# Patient Record
Sex: Male | Born: 1970 | Race: White | Hispanic: No | Marital: Married
Health system: Southern US, Community
[De-identification: ages and names within clinical notes are randomized; demographics above are authoritative.]

## PROBLEM LIST (undated history)

## (undated) DIAGNOSIS — L309 Dermatitis, unspecified: Secondary | ICD-10-CM

## (undated) DIAGNOSIS — G4700 Insomnia, unspecified: Secondary | ICD-10-CM

## (undated) HISTORY — DX: Dermatitis, unspecified: L30.9

## (undated) HISTORY — DX: Insomnia, unspecified: G47.00

## (undated) HISTORY — PX: OTHER SURGICAL HISTORY: SHX169

---

## 2001-05-18 ENCOUNTER — Emergency Department (HOSPITAL_COMMUNITY): Admission: EM | Admit: 2001-05-18 | Discharge: 2001-05-18 | Payer: Self-pay

## 2001-05-18 ENCOUNTER — Encounter: Payer: Self-pay | Admitting: Emergency Medicine

## 2003-07-08 ENCOUNTER — Ambulatory Visit (HOSPITAL_BASED_OUTPATIENT_CLINIC_OR_DEPARTMENT_OTHER): Admission: RE | Admit: 2003-07-08 | Discharge: 2003-07-08 | Payer: Self-pay | Admitting: Family Medicine

## 2007-11-14 ENCOUNTER — Observation Stay (HOSPITAL_COMMUNITY): Admission: AD | Admit: 2007-11-14 | Discharge: 2007-11-16 | Payer: Self-pay | Admitting: Internal Medicine

## 2007-11-25 ENCOUNTER — Inpatient Hospital Stay (HOSPITAL_COMMUNITY): Admission: AD | Admit: 2007-11-25 | Discharge: 2007-12-04 | Payer: Self-pay | Admitting: Internal Medicine

## 2007-12-11 ENCOUNTER — Encounter: Admission: RE | Admit: 2007-12-11 | Discharge: 2007-12-11 | Payer: Self-pay | Admitting: Surgery

## 2008-02-20 ENCOUNTER — Encounter: Admission: RE | Admit: 2008-02-20 | Discharge: 2008-02-20 | Payer: Self-pay | Admitting: Gastroenterology

## 2008-07-15 ENCOUNTER — Encounter: Admission: RE | Admit: 2008-07-15 | Discharge: 2008-07-15 | Payer: Self-pay | Admitting: Family Medicine

## 2010-10-17 NOTE — Discharge Summary (Signed)
NAMEJOVON, [name]                ACCOUNT NO.:  [id number]   MEDICAL RECORD NO.:  [id number]          PATIENT TYPE:  INP   LOCATION:  1502                         FACILITY:  [facility]   PHYSICIAN:  Michiel Cowboy, MDDATE OF BIRTH:  [birth year]69   DATE OF ADMISSION:  11/14/2007  DATE OF DISCHARGE:  11/16/2007                               DISCHARGE SUMMARY   ATTENDING PHYSICIAN:  Michiel Cowboy, MD   PRIMARY CARE PHYSICIAN:  Dr. Para March.   DISCHARGE DIAGNOSIS:  1. Dehydration.  2. Diarrhea likely secondary to gastroenteritis.  3. History of depression.   STUDIES:  KUB done on 14 November 2007 showing nonspecific bowel gas pattern  and no obvious obstruction, perforation.  No distension.  There is  central, small bowels of air and air fluid levels, but no distension.   HOSPITAL COURSE:  Patient is a 40 year old gentleman who was admitted  with one week bout of diarrhea.  After his admission, his diet was  slowly advanced.  He was given IV fluids for rehydration.  At first  patient was orthostatic, but after aggressive rehydration, orthostasis  resolved.  At this point patient is urinating frequently,  nonorthostatic, feeling well.  Tolerated breakfast and lunch without  diarrhea.  Although did report a bout of diarrhea yesterday, which was  sent for studies.  Currently they are pending.  Patient feels very well  and wants to go home.  We will discharge home with very close followup  with Dr. Para March.  Instructed patient to have low residue diet and  lactose free.  Give him Asacol as needed for diarrhea.  At this point, I  would give him Imodium until sure that this is noninfectious etiology.  If diarrhea returns, would recommend further outpatient workup.  Asked  patient to avoid use of frequent ibuprofen and do not use it for longer  than 2 weeks.   DISCHARGE MEDICATIONS:  1. Asacol 80 ng p.o. t.i.d..d as needed.  2. Cymbalta 60 mg p.o. daily.  3. Tritura 80 mg p.o. daily.  4. Follow up with Dr. Para March in one week or sooner if needed.      Michiel Cowboy, MD  Electronically Signed     AVD/MEDQ  D:  11/16/2007  T:  11/16/2007  Job:  [id number]

## 2010-10-17 NOTE — H&P (Signed)
[name], [name]                ACCOUNT NO.:  [id number]   MEDICAL RECORD NO.:  [id number]          PATIENT TYPE:  INP   LOCATION:  1502                         FACILITY:  [facility]   PHYSICIAN:  Michiel Cowboy, MDDATE OF BIRTH:  [birth year]   DATE OF ADMISSION:  11/14/2007  DATE OF DISCHARGE:                              HISTORY & PHYSICAL   ATTENDING PHYSICIAN:  Dwana Curd. Para March, M.D. with [facility] at  [facility].   CHIEF COMPLAINT:  Diarrhea.   Patient is a 40 year old gentleman with a history of depression, who  comes in with one week's worth of diarrhea and abdominal pain.  It  started on Sunday after a picnic at [facility].  Patient woke up in the  morning with abdominal pain and diarrhea, which persisted.  Patient  first went to his primary care [name] and at that point was deemed to  have possible viral gastroenteritis with no dehydration.  The patient  denied any nausea or vomiting.  Was able to keep po fluids.  His pain  first was epigastric, then became mid abdominal, and continued to be off  and on throughout the day.  Yesterday, he continued to have severe  diarrhea without any improvement, so today he presented to his primary  care [name].  He continues to have diarrhea and feels dehydration.  Prior to that appointment, he took some Imodium, after which his  diarrhea has stopped altogether.  In the office, he was noted to be  orthostatic.  [facility] called to admit the patient.   Patient also endorses that today he felt hot, like as if he had a fever,  but he never took his temperature.   PAST MEDICAL HISTORY:  Significant for depression.   SOCIAL HISTORY:  Patient currently smokes a half pack per day.  Does not  use drugs or drink alcohol.  Of note, patient has a remote history of  alcohol and drug abuse.  No IV drug abuse ever.   FAMILY HISTORY:  Noncontributory.   ALLERGIES:  Patient is allergic to PENICILLIN and ERYTHROMYCIN.   MEDICATIONS:  1. Cymbalta 60 mg p.o. daily.  2. Ibuprofen as needed.  3. Strattera 80 mg p.o. daily.  4. Gas-X, Maalox, and Tylenol as needed.   PHYSICAL EXAMINATION:  VITALS:  Temperature 98.9, heart rate 97, blood  pressure 130/80, respirations 18, satting 98% on room air.  Patient is  orthostatic.  Patient appears to be alert in no acute distress, lying on the  stretcher.  Dry mucous membranes.  Head atraumatic.  LUNGS:  Clear to auscultation bilaterally.  HEART:  Regular rate and rhythm with no murmurs, rubs or gallops.  ABDOMEN:  Soft, nontender, nondistended.  Patient currently has no  abdominal pain.  LOWER EXTREMITIES:  Without any edema.  Skin turgor within normal  limits.  NEUROLOGIC:  Intact.   LABS:  White blood cell count 11.2, hemoglobin 14.9, platelets 199.  Sodium 136, potassium 4.2, bicarb 30, creatinine 0.85.  LFTs within  normal limits.  Lipase 31, albumin 3.1.   Chest x-ray done showed nonspecific bowel gas pattern,  no obvious  obstruction or perforation, with some central small bowel loops, air,  and air/fluid levels, but no distention.   ASSESSMENT/PLAN:  This is a 40 year old gentleman with a history of  diarrhea, admitted further evaluation and rehydration.  1. Dehydration:  We will give IV fluids aggressively and check      orthostatics.  2. Diarrhea:  Will send for stool studies if patient has any more      bowel movements.  Currently, this has stopped.  We will pursue      further imaging if symptoms resume or if he has abdominal pain.      Currently, KUB did not show any evidence of intra-abdominal      pathology.  3. Depression:  Continue home meds.  4. Prophylaxis:  SCDs, Protonix.      Michiel Cowboy, MD  Electronically Signed     AVD/MEDQ  D:  11/14/2007  T:  11/14/2007  Job:  [id number]   cc:   Dwana Curd. Para March, M.D.  Fax: [phone]

## 2010-10-17 NOTE — Discharge Summary (Signed)
NAMEHALDON, CARLEY                ACCOUNT NO.:  [id number]   MEDICAL RECORD NO.:  [id number]          PATIENT TYPE:  INP   LOCATION:  1311                         FACILITY:  [facility]   PHYSICIAN:  Kela Millin, M.D.DATE OF BIRTH:  Oct 28, 1970   DATE OF ADMISSION:  11/25/2007  DATE OF DISCHARGE:  12/04/2007                               DISCHARGE SUMMARY   DISCHARGE DIAGNOSES:  1. Diverticulitis with pelvic/diverticular abscess.  2. History of depression.  3. Tobacco abuse.  4. History of narcotic addiction.  5. History of ADHD.   PROCEDURES AND STUDIES:  1. CT-guided drainage of pelvic abscess per interventional radiology.  2. CT scan of abdomen and pelvis done at outside location - reviewed      with [facility] radiology, and the patient noted to have about a 7-      cm pelvic abscess.  3. Followup CT scan on December 03, 2007 - mucosal thickening of the      terminal ileum may be related to adjacent diverticular abscess.      Correlate with signs and symptoms of inflammatory bowel disease per      radiologist.  4. CT scan of pelvis - marked reduction in the volume of large pelvic      abscess with only 2 small fluid collections remaining.  Marked      thickening of the terminal ileum.  May be secondary to reported      diverticular disease; however, consider inflammatory bowel disease      per radiologist.   BRIEF HISTORY:  The patient is a 40 year old white male with the above-  listed medical problems who presented with complaints of abdominal pain.  It was noted that he had been discharged from the hospitalist service  about 10 days prior to this admission following treatment for diarrhea  and dehydration.  The patient reported that at his follow-up visit with  Dr. Hyacinth Meeker he had been doing well but then developed severe left lower  quadrant pain.  Lab work revealed a white cell count of 22, and CT scan  was done at an outside facility which revealed left lower quadrant  abscess secondary to diverticulitis.  Dr. Hyacinth Meeker was contacted, and the  patient then directly admitted to the hospital for further evaluation  and management.  Upon admission, he was found to have a white cell count  of 18, and he admitted to some diarrhea but stated that it was better  and that the abdominal pain was also better.  He was admitted for  further evaluation and management.   Please see the full admission history and physical dictated on November 25, 2007 by Dr. Rito Ehrlich for the details of the admission physical exam as  well as the laboratory data.   HOSPITAL COURSE:  1. Diverticulitis with pelvic abscess:  Upon admission, the patient      was started on empiric antibiotics to cover for diverticulitis.      The CT scan of his abdomen was reviewed with interventional      radiology, and it was noted that the abscess  was 7 cm.  Following      this, interventional radiology/surgery were consulted, and CT-      guided drainage of the abscess was done on November 28, 2007 by Dr.      Fredia Sorrow, and grossly purulent fluid was obtained and sent for      cultures.  The cultures grew group A strep - strep pyrogenes.  The      patient was has been maintained on Cipro and Flagyl, and surgery      and interventional radiology have been following.  A repeat CT scan      was done which showed significant improvement in the abscess.  His      drain is still in place, and he got a total of 15 mL out in the      last 24 hours.  Interventional radiology, and surgery have seen the      patient and both recommend from this standpoint that he be      discharged home.  Surgery has instructed the patient to do daily      irrigation of the drain with 10 mL of sterile saline.  He is to      follow up for a repeat CT with surgery in 1 week as outpatient, and      at that point, injection prior to discontinuation of the drain will      be done.  His symptoms have improved; he is tolerating p.o. well       and has been advanced to regular diet which he has done well with.      He is afebrile and hemodynamically stable, and his last white cell      count prior to discharge today is 6.  On the repeat CT scan of the      abdomen, the radiologist indicated that the patient has significant      thickening of the terminal ileum, and it is recommended that the      patient follows up outpatient with gastroenterology when he has      recovered well from this episode for further evaluation/workup for      possible inflammatory bowel disease/Crohn's.  2. History of depression/attention-deficit hyperactivity disorder.      The patient was maintained on his outpatient medications and is to      continue this upon discharge.   DISCHARGE MEDICATIONS:  1. Cipro 500 mg p.o. b.i.d. for 2 more weeks or as directed per      surgery.  2. Flagyl 500 mg p.o. t.i.d. for 2 more weeks or as directed per      surgery.  3. Continue Cymbalta 60 mg p.o. daily.  4. Strattera 80 mg p.o. daily.  5. Also continue Asacol 80 mg p.o. t.i.d. as needed.  6. Tylenol 650 p.o. q.4-6 h p.r.n.   DISCHARGE INSTRUCTIONS:  1. The patient to follow up with Dr. Corliss Skains in 1 week and is to have a      CT scan on that follow-up visit.  2. The patient is to have drain irrigated with 10 mL of sterile saline      daily as instructed by surgery.  Home health to follow.   DISCHARGE CONDITION:  Improved/stable.      Kela Millin, M.D.  Electronically Signed     ACV/MEDQ  D:  12/04/2007  T:  12/04/2007  Job:  [id number]   cc:   Sigmund Hazel, M.D.  Fax: [phone]  Wilmon Arms. Corliss Skains, M.D.  [address] [place] [address] [id number]  [place]

## 2010-10-17 NOTE — Consult Note (Signed)
NAMEJASMOND, [name]                ACCOUNT NO.:  [id number]   MEDICAL RECORD NO.:  [id number]          PATIENT TYPE:  INP   LOCATION:  1311                         FACILITY:  [facility]   PHYSICIAN:  Wilmon Arms. Corliss Skains, M.D.      DATE OF BIRTH:   DATE OF CONSULTATION:  11/27/2007  DATE OF DISCHARGE:                                 CONSULTATION   REASON FOR CONSULTATION:  Diverticulitis with diverticular abscess.   HISTORY OF PRESENT ILLNESS:  The patient is a 40 year old male who  presents with a 3 week history of abdominal pain localized down to his  pelvis associated with dehydration and diarrhea.  He had been admitted  previously for hydration.  At his followup with Dr. Sigmund Hazel, his  primary care physician, he was noted to have an elevated white count of  22 with severe lower abdominal pain.  This is located in the pelvis in  the midline.  A CT scan showed a left lower quadrant abscess secondary  to diverticulitis.  He was admitted to the hospital on November 25, 2007.  He was placed on IV antibiotics.  The patient is continuing to have pain  but is feeling better.  He has been afebrile over the last day.   PAST MEDICAL HISTORY:  1. History of narcotic addiction.  2. Tobacco abuse.  3. History of depression.  4. ADHD.   MEDICATIONS:  Strattera, Cymbalta.   PAST SURGICAL HISTORY:  Wisdom tooth extraction.   ALLERGIES:  PENICILLIN WHICH CAUSES A RASH.  ERYTHROMYCIN WHICH CAUSES  STOMACH UPSET.   FAMILY HISTORY:  Noncontributory.   SOCIAL HISTORY:  He smokes 1/2 pack a day.  No alcohol use.   PHYSICAL EXAMINATION:  Temperature 98.4, pulse 77, respirations 20,  blood pressure 124/73.  Saturation 98%.  This is a well developed, well nourished male in no appar net distress.  HEENT:  EOMI.  Sclerae anicteric.  NECK:  No mass or thyromegaly.  LUNGS:  Clear to auscultation bilaterally.  Normal respiratory effort.  HEART:  Regular rate and rhythm.  No murmur.  ABDOMEN:  Positive  bowel sounds.  Soft, tender in both lower quadrants  and in the suprapubic region.  No palpable masses, no rebound, no  guarding.  EXTREMITIES:  No edema.  SKIN:  Warm and dry.  No signs of jaundice.   IMPRESSION:  Diverticulitis with peridiverticular abscess.  This  measures about 7 cm by report.   RECOMMENDATIONS:  Continue IV antibiotics.  Patient is scheduled for CT  guided drainage of the pelvic abscess tomorrow.  If this is successful  in draining the abscess, then he will need outpatient followup until the  time of drain removal.  He will then need a colonoscopy and probable  elective sigmoid colectomy.  If he worsens here in the hospital, then he  will need urgent sigmoid colectomy with  probable descending colostomy  Hartman's pouch.  This would be a temporary colostomy.  I discussed all  this with the patient and his wife.  We will continue to follow with  you.  Wilmon Arms. Tsuei, M.D.  Electronically Signed     MKT/MEDQ  D:  11/27/2007  T:  11/27/2007  Job:  [id number]   cc:   Ramiro Harvest, MD

## 2010-10-17 NOTE — H&P (Signed)
[name], [name]                ACCOUNT NO.:  [id number]   MEDICAL RECORD NO.:  [id number]          PATIENT TYPE:  INP   LOCATION:  1311                         [facility]ACILITY:  [facility]   PHYSICIAN:  Hollice Espy, M.D.DATE OF BIRTH:  1971/02/13   DATE OF ADMISSION:  11/25/2007  DATE OF DISCHARGE:                              HISTORY & PHYSICAL   The patient's PCP is Dr. Sigmund Hazel.   CHIEF COMPLAINT:  Is abdominal pain.   HISTORY OF PRESENT ILLNESS:  The patient is a 40 year old white male  with past medical history of depression and previous narcotic abuse, now  clean, who was discharged from the hospitalist service approximately 10  days ago for dehydration and diarrhea.  Initially was felt to be  secondary to gastroenteritis.  He was followed with Dr. Hyacinth Meeker and doing  well in his follow-up visit a week later and then today presents  complaining of severe lower quadrant abdominal pain.  A white count was  found to be elevated at 22, and the patient went for a CT scan and was  noted to have a left lower quadrant abscess secondary to acute  diverticulitis.  Dr. Hyacinth Meeker contacted [facility], and the patient  was made a direct ,admission and upon arrival to the floor, he had a  repeat labs done.  He was found to have a white count of 18.1.  The rest  of labs were unremarkable.  In review of his previous stool cultures,  these were all negative.  Currently, patient is feeling better after  receiving some IV fluids and medication for nausea.  Denies any  headaches, vision changes, dysphagia, recurrent chest pain,  palpitations, shortness of breath, wheeze, cough.  His belly pain is  better.  No hematuria, dysuria, constipation.  He had some diarrhea, now  better.  No focal extremity numbness, weakness or pain.  Overall, he  feels quite fatigued.  Review of systems otherwise negative.   The patient's past medical history includes:  1. History of narcotic addiction now clean.  2. Ongoing tobacco abuse.  3. History of depression.  4. History of ADHD.   MEDICATIONS:  He is on Strattera 80 mg p.o. daily and Cymbalta 60 mg  p.o. daily.   HE HAS ALLERGIES TO PENICILLIN, ERYTHROMYCIN AND HAS REQUESTED NOT TO BE  ON ANY TYPE OF NARCOTICS GIVEN HIS PREVIOUS HISTORY OF ADDICTION.   FAMILY HISTORY:  is noncontributory.   SOCIAL HISTORY:  He denies any alcohol use.  He smokes about a half-pack  per day and previously had a history narcotic abuse, now clean.   PHYSICAL EXAMINATION:  The patient's vitals on admission:  Temperature  98.6, heart rate 88, blood pressure 132/70 and respirations 18, O2  saturation 96% on room air.  GENERAL:  He is alert and oriented x3 in no apparent distress.  HEENT:  Normocephalic, atraumatic.  Mucous membranes are moist.  He has  no carotid bruits.  HEART:  Regular rate and rhythm.  S1-S2.  LUNGS:  Clear to auscultation bilaterally.  ABDOMEN:  Soft, nontender, mild distention.  Decreased bowel sounds.  EXTREMITIES:  Showed no clubbing, cyanosis or edema.   LABORATORY WORK:  Sodium 136, potassium 4.5, chloride 97, bicarb 31, BUN  10, creatinine 0.9, glucose 108.  White count 18.1, H&H 14.8 and 44, MCV  of 84, platelet count 262.   PLAN:  1. Diverticulitis with abscess.  N.p.o.  IV antibiotics plus IV PPI.      Will follow white count in the morning.  2. History of narcotic addiction.  He is clean now.  No narcotics.      Treat with p.r.n. Toradol.  The patient has requested no opiates.  3. Depression.  Continue SSRI.  4. Tobacco abuse.  The patient declines patch.      Hollice Espy, M.D.  Electronically Signed     SKK/MEDQ  D:  11/25/2007  T:  11/25/2007  Job:  [id number]   cc:   Sigmund Hazel, M.D.  Fax: [phone]

## 2011-03-01 LAB — CBC
HCT: 36.9 — ABNORMAL LOW
HCT: 40.4
HCT: 43.7
HCT: 37 — ABNORMAL LOW
HCT: 37.1 — ABNORMAL LOW
HCT: 37.5 — ABNORMAL LOW
HCT: 38.1 — ABNORMAL LOW
HCT: 44.4
HCT: 42
Hemoglobin: 12.6 — ABNORMAL LOW
Hemoglobin: 13.8
Hemoglobin: 14.9
Hemoglobin: 12.2 — ABNORMAL LOW
Hemoglobin: 12.3 — ABNORMAL LOW
Hemoglobin: 12.4 — ABNORMAL LOW
Hemoglobin: 12.6 — ABNORMAL LOW
Hemoglobin: 14.8
Hemoglobin: 14
MCHC: 34.2
MCHC: 33.2
MCHC: 33.3
MCHC: 33.3
MCV: 83
MCV: 84.3
MCV: 83
MCV: 83.1
MCV: 84.3
MCV: 82.8
Platelets: 199
Platelets: 194
Platelets: 214
Platelets: 221
Platelets: 235
Platelets: 238
Platelets: 262
Platelets: 262
RBC: 4.45
RBC: 4.81
RBC: 4.4
RBC: 4.44
RBC: 5.07
RDW: 14.3
RDW: 13.6
RDW: 13.9
RDW: 14
RDW: 14.2
RDW: 14.3
RDW: 14.3
RDW: 13.8
WBC: 15.5 — ABNORMAL HIGH
WBC: 4
WBC: 4.3
WBC: 7
WBC: 9.8
WBC: 4.8
WBC: 6

## 2011-03-01 LAB — BASIC METABOLIC PANEL
BUN: 10
BUN: 11
BUN: 3 — ABNORMAL LOW
BUN: 4 — ABNORMAL LOW
BUN: 5 — ABNORMAL LOW
CO2: 24
CO2: 31
CO2: 31
Calcium: 7.7 — ABNORMAL LOW
Calcium: 8.4
Calcium: 8.1 — ABNORMAL LOW
Calcium: 8.2 — ABNORMAL LOW
Calcium: 8.6
Calcium: 9
Chloride: 104
Chloride: 101
Chloride: 103
Chloride: 103
Creatinine, Ser: 0.74
Creatinine, Ser: 0.79
Creatinine, Ser: 0.91
GFR calc Af Amer: >60
GFR calc Af Amer: >60
GFR calc Af Amer: >60
GFR calc Af Amer: >60
GFR calc non Af Amer: >60
GFR calc non Af Amer: >60
GFR calc non Af Amer: >60
GFR calc non Af Amer: >60
GFR calc non Af Amer: >60
GFR calc non Af Amer: >60
Glucose, Bld: 109 — ABNORMAL HIGH
Glucose, Bld: 88
Glucose, Bld: 100 — ABNORMAL HIGH
Glucose, Bld: 103 — ABNORMAL HIGH
Glucose, Bld: 108 — ABNORMAL HIGH
Glucose, Bld: 116 — ABNORMAL HIGH
Glucose, Bld: 93
Potassium: 3.3 — ABNORMAL LOW
Potassium: 3.8
Potassium: 3.6
Potassium: 3.8
Potassium: 4.1
Potassium: 4.2
Potassium: 4.3
Sodium: 136
Sodium: 138
Sodium: 136
Sodium: 136
Sodium: 136
Sodium: 137
Sodium: 137
Sodium: 139
Sodium: 140

## 2011-03-01 LAB — COMPREHENSIVE METABOLIC PANEL
Albumin: 3.1 — ABNORMAL LOW
Alkaline Phosphatase: 92
BUN: 10
Creatinine, Ser: 0.85
Glucose, Bld: 118 — ABNORMAL HIGH
Total Bilirubin: 0.7
Total Protein: 6.5

## 2011-03-01 LAB — DIFFERENTIAL
Basophils Absolute: 0
Basophils Absolute: 0
Basophils Relative: 0
Eosinophils Relative: 1
Lymphocytes Relative: 7 — ABNORMAL LOW
Lymphocytes Relative: 7 — ABNORMAL LOW
Lymphocytes Relative: 17
Lymphs Abs: 0.6 — ABNORMAL LOW
Lymphs Abs: 0.8
Monocytes Absolute: 1.3 — ABNORMAL HIGH
Monocytes Absolute: 0.7
Monocytes Relative: 11
Monocytes Relative: 7
Neutro Abs: 9 — ABNORMAL HIGH
Neutro Abs: 3.4
Neutrophils Relative %: 81 — ABNORMAL HIGH
Neutrophils Relative %: 71

## 2011-03-01 LAB — OVA AND PARASITE EXAMINATION: Ova and parasites: NONE SEEN

## 2011-03-01 LAB — ANAEROBIC CULTURE

## 2011-03-01 LAB — CULTURE, ROUTINE-ABSCESS

## 2011-03-01 LAB — STOOL CULTURE

## 2011-03-01 LAB — PROTIME-INR: Prothrombin Time: 16.3 — ABNORMAL HIGH

## 2017-01-07 DIAGNOSIS — M79602 Pain in left arm: Secondary | ICD-10-CM | POA: Diagnosis not present

## 2017-01-07 DIAGNOSIS — M25512 Pain in left shoulder: Secondary | ICD-10-CM | POA: Diagnosis not present

## 2017-01-10 DIAGNOSIS — M25512 Pain in left shoulder: Secondary | ICD-10-CM | POA: Diagnosis not present

## 2017-01-10 DIAGNOSIS — M79602 Pain in left arm: Secondary | ICD-10-CM | POA: Diagnosis not present

## 2017-01-21 DIAGNOSIS — M25512 Pain in left shoulder: Secondary | ICD-10-CM | POA: Diagnosis not present

## 2017-01-21 DIAGNOSIS — M79602 Pain in left arm: Secondary | ICD-10-CM | POA: Diagnosis not present

## 2017-01-24 DIAGNOSIS — M25512 Pain in left shoulder: Secondary | ICD-10-CM | POA: Diagnosis not present

## 2017-01-24 DIAGNOSIS — M79602 Pain in left arm: Secondary | ICD-10-CM | POA: Diagnosis not present

## 2017-01-28 DIAGNOSIS — M79602 Pain in left arm: Secondary | ICD-10-CM | POA: Diagnosis not present

## 2017-01-28 DIAGNOSIS — M25512 Pain in left shoulder: Secondary | ICD-10-CM | POA: Diagnosis not present

## 2017-01-29 DIAGNOSIS — L739 Follicular disorder, unspecified: Secondary | ICD-10-CM | POA: Diagnosis not present

## 2017-01-29 DIAGNOSIS — G4700 Insomnia, unspecified: Secondary | ICD-10-CM | POA: Diagnosis not present

## 2017-01-31 DIAGNOSIS — M25512 Pain in left shoulder: Secondary | ICD-10-CM | POA: Diagnosis not present

## 2017-01-31 DIAGNOSIS — M79602 Pain in left arm: Secondary | ICD-10-CM | POA: Diagnosis not present

## 2017-02-06 DIAGNOSIS — M79602 Pain in left arm: Secondary | ICD-10-CM | POA: Diagnosis not present

## 2017-02-06 DIAGNOSIS — M25512 Pain in left shoulder: Secondary | ICD-10-CM | POA: Diagnosis not present

## 2017-02-28 DIAGNOSIS — M79602 Pain in left arm: Secondary | ICD-10-CM | POA: Diagnosis not present

## 2017-02-28 DIAGNOSIS — M25512 Pain in left shoulder: Secondary | ICD-10-CM | POA: Diagnosis not present

## 2017-03-14 DIAGNOSIS — M79602 Pain in left arm: Secondary | ICD-10-CM | POA: Diagnosis not present

## 2017-03-14 DIAGNOSIS — M25512 Pain in left shoulder: Secondary | ICD-10-CM | POA: Diagnosis not present

## 2017-03-18 DIAGNOSIS — M25512 Pain in left shoulder: Secondary | ICD-10-CM | POA: Diagnosis not present

## 2017-03-18 DIAGNOSIS — M79602 Pain in left arm: Secondary | ICD-10-CM | POA: Diagnosis not present

## 2017-04-01 DIAGNOSIS — M25512 Pain in left shoulder: Secondary | ICD-10-CM | POA: Diagnosis not present

## 2017-04-01 DIAGNOSIS — M79602 Pain in left arm: Secondary | ICD-10-CM | POA: Diagnosis not present

## 2017-12-25 DIAGNOSIS — Z87891 Personal history of nicotine dependence: Secondary | ICD-10-CM | POA: Diagnosis not present

## 2017-12-25 DIAGNOSIS — Z13220 Encounter for screening for lipoid disorders: Secondary | ICD-10-CM | POA: Diagnosis not present

## 2017-12-25 DIAGNOSIS — Z131 Encounter for screening for diabetes mellitus: Secondary | ICD-10-CM | POA: Diagnosis not present

## 2017-12-25 DIAGNOSIS — E669 Obesity, unspecified: Secondary | ICD-10-CM | POA: Diagnosis not present

## 2017-12-25 DIAGNOSIS — G4700 Insomnia, unspecified: Secondary | ICD-10-CM | POA: Diagnosis not present

## 2017-12-25 DIAGNOSIS — Z0000 Encounter for general adult medical examination without abnormal findings: Secondary | ICD-10-CM | POA: Diagnosis not present

## 2018-03-31 ENCOUNTER — Other Ambulatory Visit: Payer: Self-pay | Admitting: Family Medicine

## 2018-03-31 ENCOUNTER — Ambulatory Visit
Admission: RE | Admit: 2018-03-31 | Discharge: 2018-03-31 | Disposition: A | Discharge status: Home / Self Care | Payer: BLUE CROSS/BLUE SHIELD | Source: Ambulatory Visit | Attending: Family Medicine | Admitting: Family Medicine

## 2018-03-31 DIAGNOSIS — R1031 Right lower quadrant pain: Secondary | ICD-10-CM

## 2018-03-31 DIAGNOSIS — N2889 Other specified disorders of kidney and ureter: Secondary | ICD-10-CM | POA: Diagnosis not present

## 2018-04-01 ENCOUNTER — Other Ambulatory Visit: Payer: Self-pay | Admitting: Family Medicine

## 2018-04-01 DIAGNOSIS — R1031 Right lower quadrant pain: Secondary | ICD-10-CM

## 2018-04-01 DIAGNOSIS — N281 Cyst of kidney, acquired: Secondary | ICD-10-CM

## 2018-05-21 ENCOUNTER — Ambulatory Visit
Admission: RE | Admit: 2018-05-21 | Discharge: 2018-05-21 | Disposition: A | Discharge status: Home / Self Care | Payer: BLUE CROSS/BLUE SHIELD | Source: Ambulatory Visit | Attending: Family Medicine | Admitting: Family Medicine

## 2018-05-21 DIAGNOSIS — N1330 Unspecified hydronephrosis: Secondary | ICD-10-CM | POA: Diagnosis not present

## 2018-05-21 DIAGNOSIS — N281 Cyst of kidney, acquired: Secondary | ICD-10-CM

## 2018-05-25 DIAGNOSIS — R350 Frequency of micturition: Secondary | ICD-10-CM | POA: Diagnosis not present

## 2018-06-23 DIAGNOSIS — Z87442 Personal history of urinary calculi: Secondary | ICD-10-CM | POA: Diagnosis not present

## 2018-06-23 DIAGNOSIS — N281 Cyst of kidney, acquired: Secondary | ICD-10-CM | POA: Diagnosis not present

## 2018-08-15 ENCOUNTER — Other Ambulatory Visit: Payer: Self-pay | Admitting: Urology

## 2018-08-15 DIAGNOSIS — N281 Cyst of kidney, acquired: Secondary | ICD-10-CM

## 2018-10-17 ENCOUNTER — Other Ambulatory Visit: Payer: BLUE CROSS/BLUE SHIELD

## 2018-11-26 ENCOUNTER — Ambulatory Visit
Admission: RE | Admit: 2018-11-26 | Discharge: 2018-11-26 | Disposition: A | Discharge status: Home / Self Care | Payer: Self-pay | Source: Ambulatory Visit | Attending: Urology | Admitting: Urology

## 2018-11-26 ENCOUNTER — Other Ambulatory Visit: Payer: Self-pay

## 2018-11-26 DIAGNOSIS — N281 Cyst of kidney, acquired: Secondary | ICD-10-CM

## 2018-11-26 MED ORDER — GADOBENATE DIMEGLUMINE 529 MG/ML IV SOLN
20.0000 mL | Freq: Once | INTRAVENOUS | Status: AC | PRN
  Administered 2018-11-26: 20 mL via INTRAVENOUS

## 2021-06-21 DIAGNOSIS — E669 Obesity, unspecified: Secondary | ICD-10-CM | POA: Diagnosis not present

## 2021-06-21 DIAGNOSIS — G4733 Obstructive sleep apnea (adult) (pediatric): Secondary | ICD-10-CM | POA: Diagnosis not present

## 2021-06-21 DIAGNOSIS — G4721 Circadian rhythm sleep disorder, delayed sleep phase type: Secondary | ICD-10-CM | POA: Diagnosis not present

## 2021-06-21 DIAGNOSIS — G4734 Idiopathic sleep related nonobstructive alveolar hypoventilation: Secondary | ICD-10-CM | POA: Diagnosis not present

## 2021-06-28 DIAGNOSIS — G4733 Obstructive sleep apnea (adult) (pediatric): Secondary | ICD-10-CM | POA: Diagnosis not present

## 2021-07-29 DIAGNOSIS — G4733 Obstructive sleep apnea (adult) (pediatric): Secondary | ICD-10-CM | POA: Diagnosis not present

## 2021-08-21 DIAGNOSIS — K921 Melena: Secondary | ICD-10-CM | POA: Diagnosis not present

## 2021-08-21 DIAGNOSIS — R1030 Lower abdominal pain, unspecified: Secondary | ICD-10-CM | POA: Diagnosis not present

## 2021-08-21 DIAGNOSIS — R198 Other specified symptoms and signs involving the digestive system and abdomen: Secondary | ICD-10-CM | POA: Diagnosis not present

## 2021-08-21 DIAGNOSIS — K219 Gastro-esophageal reflux disease without esophagitis: Secondary | ICD-10-CM | POA: Diagnosis not present

## 2021-08-22 ENCOUNTER — Ambulatory Visit
Admission: RE | Admit: 2021-08-22 | Discharge: 2021-08-22 | Disposition: A | Discharge status: Home / Self Care | Payer: BC Managed Care – PPO | Source: Ambulatory Visit | Attending: Gastroenterology | Admitting: Gastroenterology

## 2021-08-22 ENCOUNTER — Other Ambulatory Visit: Payer: Self-pay | Admitting: Gastroenterology

## 2021-08-22 DIAGNOSIS — R198 Other specified symptoms and signs involving the digestive system and abdomen: Secondary | ICD-10-CM

## 2021-08-22 DIAGNOSIS — K5900 Constipation, unspecified: Secondary | ICD-10-CM | POA: Diagnosis not present

## 2021-08-26 DIAGNOSIS — G4733 Obstructive sleep apnea (adult) (pediatric): Secondary | ICD-10-CM | POA: Diagnosis not present

## 2021-09-26 DIAGNOSIS — G4733 Obstructive sleep apnea (adult) (pediatric): Secondary | ICD-10-CM | POA: Diagnosis not present

## 2021-09-29 DIAGNOSIS — R1084 Generalized abdominal pain: Secondary | ICD-10-CM | POA: Diagnosis not present

## 2021-09-29 DIAGNOSIS — K2930 Chronic superficial gastritis without bleeding: Secondary | ICD-10-CM | POA: Diagnosis not present

## 2021-09-29 DIAGNOSIS — D125 Benign neoplasm of sigmoid colon: Secondary | ICD-10-CM | POA: Diagnosis not present

## 2021-09-29 DIAGNOSIS — D128 Benign neoplasm of rectum: Secondary | ICD-10-CM | POA: Diagnosis not present

## 2021-09-29 DIAGNOSIS — R197 Diarrhea, unspecified: Secondary | ICD-10-CM | POA: Diagnosis not present

## 2021-09-29 DIAGNOSIS — K648 Other hemorrhoids: Secondary | ICD-10-CM | POA: Diagnosis not present

## 2021-09-29 DIAGNOSIS — D127 Benign neoplasm of rectosigmoid junction: Secondary | ICD-10-CM | POA: Diagnosis not present

## 2021-09-29 DIAGNOSIS — K219 Gastro-esophageal reflux disease without esophagitis: Secondary | ICD-10-CM | POA: Diagnosis not present

## 2021-09-29 DIAGNOSIS — Z1211 Encounter for screening for malignant neoplasm of colon: Secondary | ICD-10-CM | POA: Diagnosis not present

## 2021-09-29 DIAGNOSIS — K921 Melena: Secondary | ICD-10-CM | POA: Diagnosis not present

## 2021-09-29 DIAGNOSIS — K2970 Gastritis, unspecified, without bleeding: Secondary | ICD-10-CM | POA: Diagnosis not present

## 2021-10-26 DIAGNOSIS — G4733 Obstructive sleep apnea (adult) (pediatric): Secondary | ICD-10-CM | POA: Diagnosis not present

## 2021-11-26 DIAGNOSIS — G4733 Obstructive sleep apnea (adult) (pediatric): Secondary | ICD-10-CM | POA: Diagnosis not present

## 2021-12-18 ENCOUNTER — Ambulatory Visit: Payer: BC Managed Care – PPO | Admitting: Podiatry

## 2021-12-18 ENCOUNTER — Encounter: Payer: Self-pay | Admitting: Podiatry

## 2021-12-18 DIAGNOSIS — L600 Ingrowing nail: Secondary | ICD-10-CM | POA: Diagnosis not present

## 2021-12-18 MED ORDER — NEOMYCIN-POLYMYXIN-HC 3.5-10000-1 OT SOLN: OTIC | 1 refills | Status: AC

## 2021-12-18 NOTE — Progress Notes (Signed)
Subjective:   Patient ID: [name], male   DOB: 51 y.o.   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  States that he has had family history of this condition and patient does not smoke likes to be active   Review of Systems  All other systems reviewed and are negative.       Objective:  Physical Exam Vitals and nursing note reviewed.  Constitutional:      Appearance: He is well-developed.  Pulmonary:     Effort: Pulmonary effort is normal.  Musculoskeletal:        General: Normal range of motion.  Skin:    General: Skin is warm.  Neurological:     Mental Status: He is alert.     Neurovascular status found to be intact muscle strength found to be adequate range of motion within normal limits.  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  I explained procedure risk and patient wants surgery understanding risk and after review signed consent form.  Today I infiltrated each hallux 60 mg like Marcaine mixture sterile prep done and using sterile instrumentation remove the medial lateral border exposed matrix applied phenol 3 applications 30 seconds followed by alcohol lavage sterile dressing gave instructions on soaks and to leave dressings on 24 hours but take them off earlier if throbbing were to occur.  Patient encouraged to call questions concerns which may arise

## 2021-12-18 NOTE — Patient Instructions (Signed)

## 2021-12-26 DIAGNOSIS — G4733 Obstructive sleep apnea (adult) (pediatric): Secondary | ICD-10-CM | POA: Diagnosis not present

## 2022-01-01 DIAGNOSIS — G4733 Obstructive sleep apnea (adult) (pediatric): Secondary | ICD-10-CM | POA: Diagnosis not present

## 2022-01-12 DIAGNOSIS — M25561 Pain in right knee: Secondary | ICD-10-CM | POA: Diagnosis not present

## 2022-01-13 ENCOUNTER — Emergency Department (HOSPITAL_COMMUNITY)
Admission: EM | Admit: 2022-01-13 | Discharge: 2022-01-13 | Disposition: A | Discharge status: Home / Self Care | Payer: BC Managed Care – PPO | Attending: Emergency Medicine | Admitting: Emergency Medicine

## 2022-01-13 ENCOUNTER — Emergency Department (HOSPITAL_COMMUNITY): Payer: BC Managed Care – PPO

## 2022-01-13 ENCOUNTER — Encounter (HOSPITAL_COMMUNITY): Payer: Self-pay | Admitting: Emergency Medicine

## 2022-01-13 ENCOUNTER — Other Ambulatory Visit: Payer: Self-pay

## 2022-01-13 DIAGNOSIS — R0781 Pleurodynia: Secondary | ICD-10-CM | POA: Diagnosis not present

## 2022-01-13 DIAGNOSIS — S299XXA Unspecified injury of thorax, initial encounter: Secondary | ICD-10-CM | POA: Diagnosis not present

## 2022-01-13 NOTE — Discharge Instructions (Signed)
You were seen today for left-sided rib pain.  Your work-up was reassuring for no signs of rib fracture or pneumothorax.  You may have strained one of the muscles that is in between your ribs.  Continue to take your diclofenac as previously prescribed for inflammation.  You may take Tylenol as needed for pain.  If you develop any life-threatening conditions please return to the hospital for reevaluation

## 2022-01-13 NOTE — ED Triage Notes (Signed)
Patient was pulling apart a electrical wire, he was pulling really hard and felt a crack in his left anterior upper ribcage. Reports pain w/ deep breathing.

## 2022-01-13 NOTE — ED Provider Notes (Signed)
[facility]-EMERGENCY DEPT Provider Note   CSN: [id number] Arrival date & time: 01/13/22  1250     History  Chief Complaint  Patient presents with   Rib Injury    [name] is a 51 y.o. male.  Patient presents to the hospital complaining of left-sided rib pain.  Patient states he was pulling something apart pulling with both hands simultaneously, leveraging against his ribs, and felt a pop.  He had sudden pain in the left side.  He denies shortness of breath.  He does endorse pain with deep inspiration.  Patient with past medical history significant for insomnia, eczema, previous intestinal abscess drainage  HPI     Home Medications Prior to Admission medications   Medication Sig Start Date End Date Taking? Authorizing Provider  Melatonin 3 MG CAPS 1 capsule at bedtime as needed    [provider]  neomycin-polymyxin-hydrocortisone (CORTISPORIN) OTIC solution Apply 1-2 drops to toe after soaking BID 12/18/21   Lenn Sink, DPM  traZODone (DESYREL) 100 MG tablet Take 1 tablet by mouth at bedtime.    [provider]      Allergies    Penicillins and Erythromycin    Review of Systems   Review of Systems  Respiratory:  Negative for shortness of breath.   Cardiovascular:  Negative for chest pain.  Musculoskeletal:        Rib pain, left-sided    Physical Exam Updated Vital Signs BP 116/78 (BP Location: Left Arm)   Pulse 76   Temp 98.2 F (36.8 C) (Oral)   Resp 18   Ht 6\' 1"  (1.854 m)   Wt 108.9 kg   SpO2 97%   BMI 31.66 kg/m  Physical Exam Vitals and nursing note reviewed.  Constitutional:      General: He is not in acute distress. HENT:     Head: Normocephalic and atraumatic.     Mouth/Throat:     Mouth: Mucous membranes are moist.  Eyes:     Conjunctiva/sclera: Conjunctivae normal.  Cardiovascular:     Rate and Rhythm: Normal rate and regular rhythm.     Pulses: Normal pulses.     Heart sounds: Normal heart  sounds.  Pulmonary:     Effort: Pulmonary effort is normal.     Breath sounds: Normal breath sounds.  Abdominal:     Palpations: Abdomen is soft.     Tenderness: There is no abdominal tenderness.  Musculoskeletal:        General: Tenderness present. No swelling or deformity.     Cervical back: Normal range of motion and neck supple.     Comments: Mild tenderness to the anterolateral ribs on the left side.  No bruising noted.  Skin:    General: Skin is warm and dry.     Capillary Refill: Capillary refill takes less than 2 seconds.  Neurological:     Mental Status: He is alert and oriented to person, place, and time.     ED Results / Procedures / Treatments   Labs (all labs ordered are listed, but only abnormal results are displayed) Labs Reviewed - No data to display  EKG None  Radiology DG Ribs Unilateral W/Chest Left  Result Date: 01/13/2022 CLINICAL DATA:  Left rib injury while pulling object. Left chest wall pain. EXAM: LEFT RIBS AND CHEST - 3+ VIEW COMPARISON:  Chest radiograph on 09/26/2020 FINDINGS: No fracture or other bone lesions are seen involving the ribs. There is no evidence of pneumothorax  or pleural effusion. Both lungs are clear. Heart size and mediastinal contours are within normal limits. IMPRESSION: Negative. Electronically Signed   By: Danae Orleans M.D.   On: 01/13/2022 15:17    Procedures Procedures    Medications Ordered in ED Medications - No data to display  ED Course/ Medical Decision Making/ A&P                           Medical Decision Making Amount and/or Complexity of Data Reviewed Radiology: ordered.   The patient presents with a chief complaint of left-sided rib pain.  Differential includes rib fracture, dislocation, soft tissue injury, pneumothorax, and others  I ordered and interpreted imaging including a rib series of the left side.  No fracture or bone lesion noted.  No evidence of pneumothorax or pleural effusion.  I agree with the  radiologist findings.  The patient has no shortness of breath.  No crepitus noted on physical exam.  No fracture or dislocation noted on imaging.  No pneumothorax noted on imaging.  Lung fields are clear to auscultation bilaterally with no diminished sounds.  The patient may have strained his intercostal muscles.  He currently has a prescription for diclofenac due to a knee injury.  Patient may continue to take diclofenac for inflammation.  He also take Tylenol for pain.  There is no indication for admission at this time.  Discharge home.        Final Clinical Impression(s) / ED Diagnoses Final diagnoses:  Rib pain on left side    Rx / DC Orders ED Discharge Orders     None         Pamala Duffel 01/13/22 1539    Derwood Kaplan, MD 01/13/22 [phone]

## 2022-01-26 DIAGNOSIS — G4733 Obstructive sleep apnea (adult) (pediatric): Secondary | ICD-10-CM | POA: Diagnosis not present

## 2022-02-01 DIAGNOSIS — G4733 Obstructive sleep apnea (adult) (pediatric): Secondary | ICD-10-CM | POA: Diagnosis not present

## 2022-02-26 DIAGNOSIS — G4733 Obstructive sleep apnea (adult) (pediatric): Secondary | ICD-10-CM | POA: Diagnosis not present

## 2022-03-03 DIAGNOSIS — G4733 Obstructive sleep apnea (adult) (pediatric): Secondary | ICD-10-CM | POA: Diagnosis not present

## 2022-04-01 DIAGNOSIS — G4733 Obstructive sleep apnea (adult) (pediatric): Secondary | ICD-10-CM | POA: Diagnosis not present

## 2022-04-03 DIAGNOSIS — G4733 Obstructive sleep apnea (adult) (pediatric): Secondary | ICD-10-CM | POA: Diagnosis not present

## 2022-04-12 IMAGING — CR DG ABDOMEN 1V
2 series · 2 of 2 positions shown · non-contrast
Comparison: None.

CLINICAL DATA: Altered bowel function.  Constipation.

EXAM:
ABDOMEN - 1 VIEW

[t abdomen supine (1 of 2)]
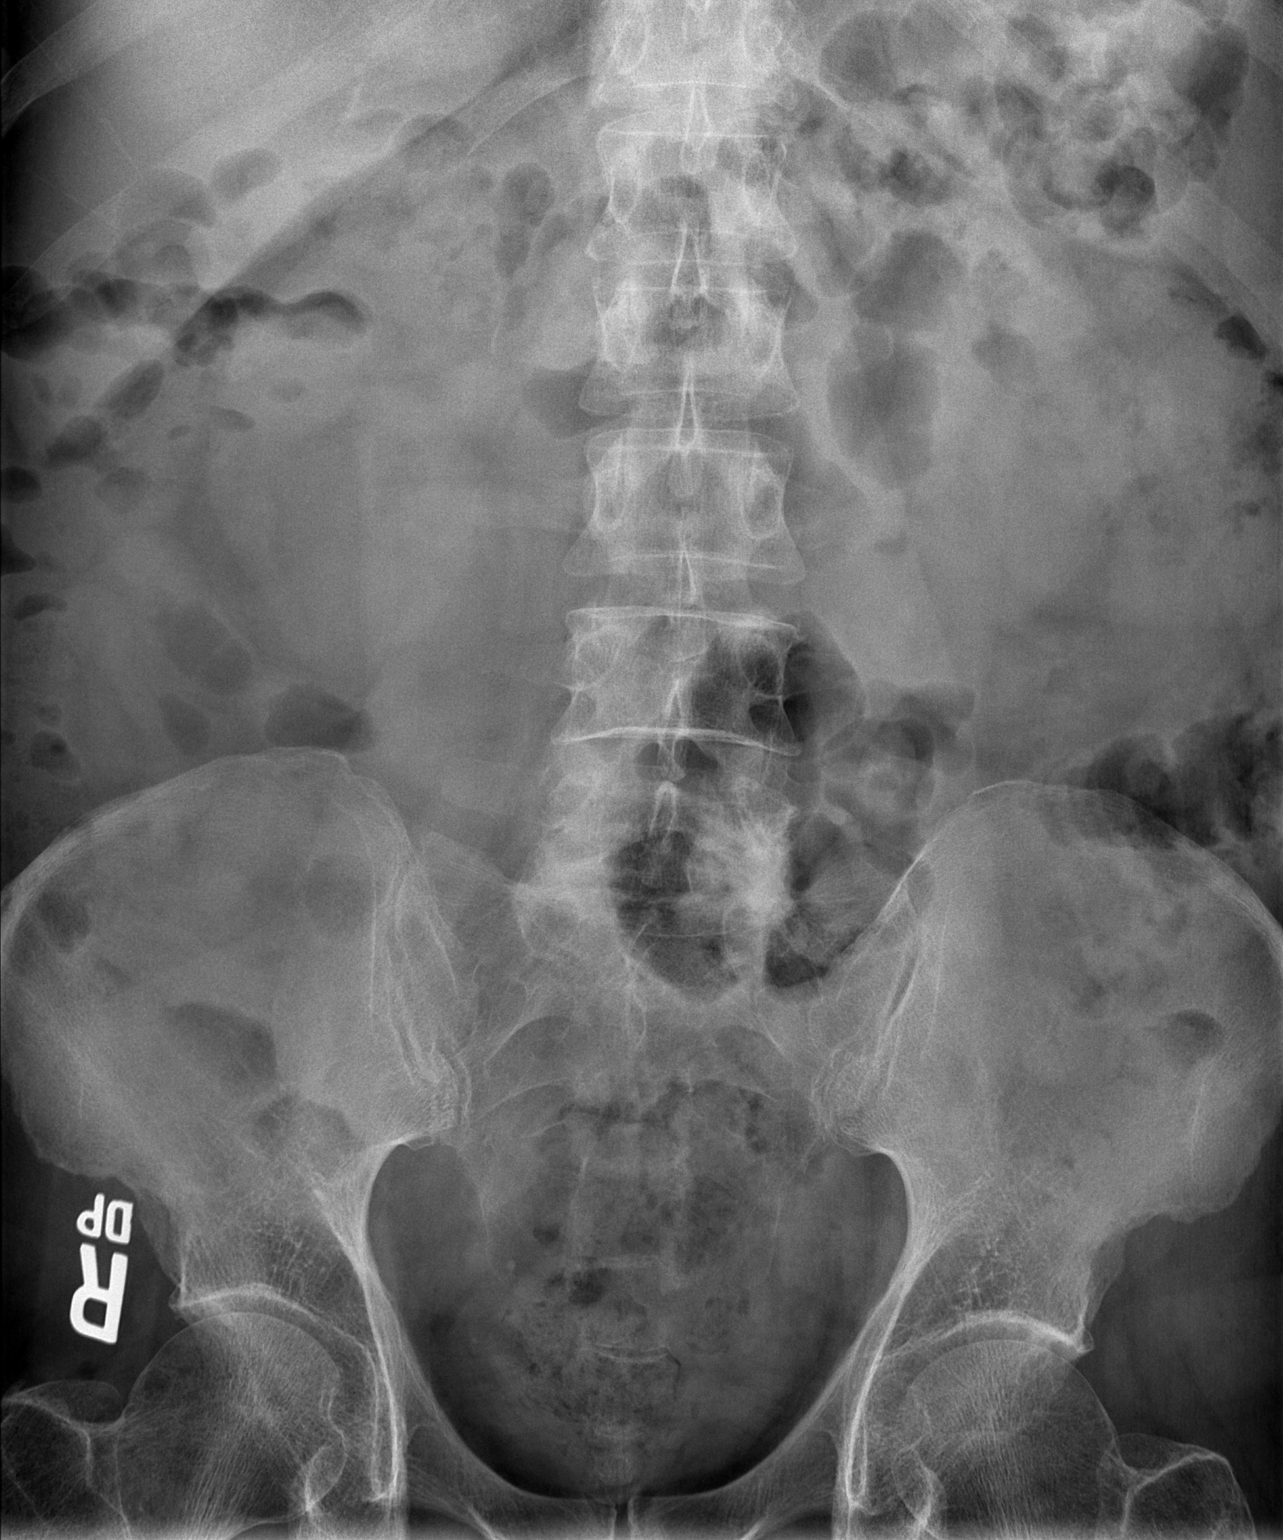

[t abdomen supine (2 of 2)]
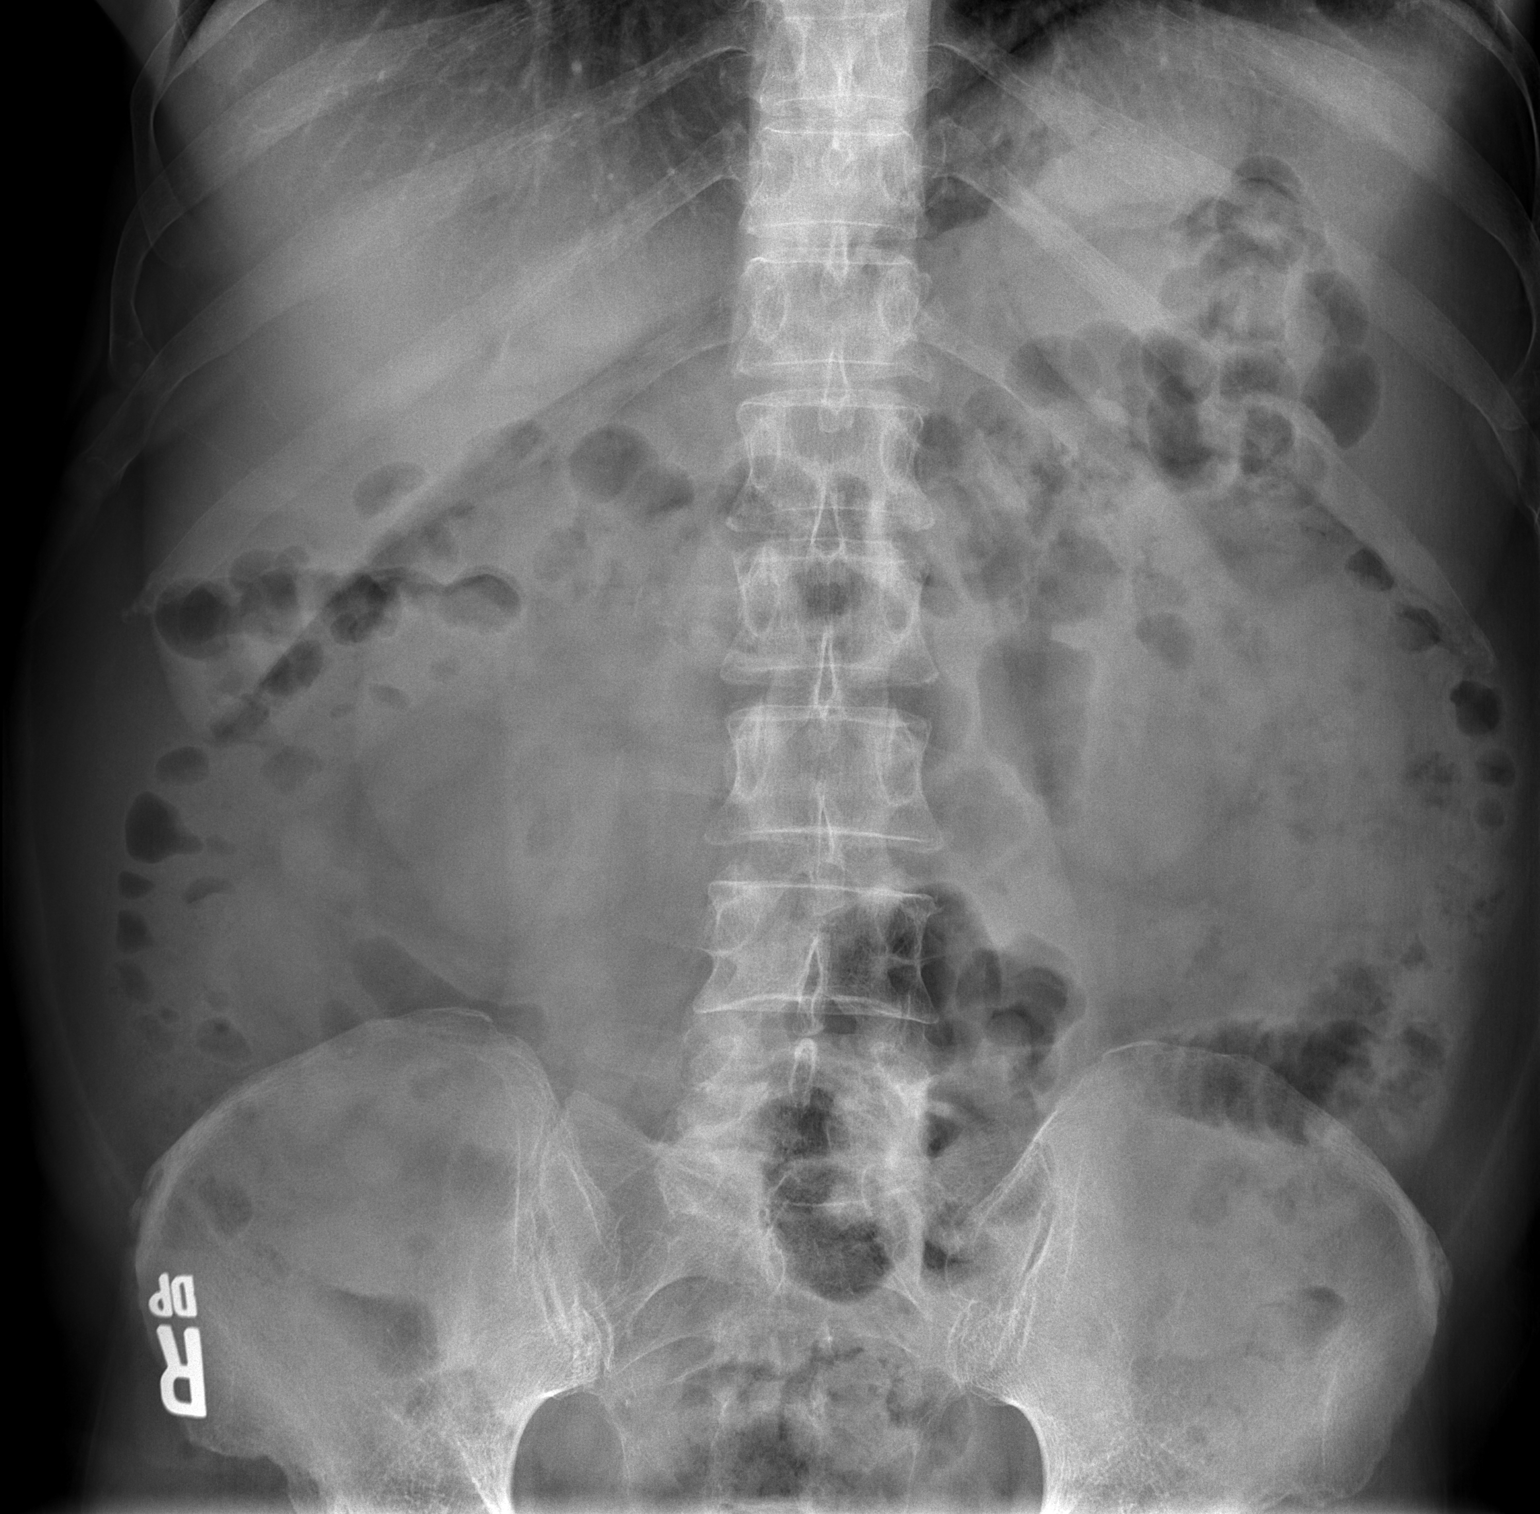

[2 of 2 positions shown; findings below may reference images not displayed]

FINDINGS: Mild fecal loading throughout the colon. No bowel obstruction. No
other abnormalities.
IMPRESSION: Mild fecal loading throughout the colon.  No other abnormalities.

## 2022-05-02 DIAGNOSIS — G4733 Obstructive sleep apnea (adult) (pediatric): Secondary | ICD-10-CM | POA: Diagnosis not present

## 2022-06-01 DIAGNOSIS — G4733 Obstructive sleep apnea (adult) (pediatric): Secondary | ICD-10-CM | POA: Diagnosis not present

## 2022-06-12 DIAGNOSIS — E669 Obesity, unspecified: Secondary | ICD-10-CM | POA: Diagnosis not present

## 2022-06-12 DIAGNOSIS — Z125 Encounter for screening for malignant neoplasm of prostate: Secondary | ICD-10-CM | POA: Diagnosis not present

## 2022-06-12 DIAGNOSIS — F17200 Nicotine dependence, unspecified, uncomplicated: Secondary | ICD-10-CM | POA: Diagnosis not present

## 2022-06-12 DIAGNOSIS — Z23 Encounter for immunization: Secondary | ICD-10-CM | POA: Diagnosis not present

## 2022-06-12 DIAGNOSIS — Z13220 Encounter for screening for lipoid disorders: Secondary | ICD-10-CM | POA: Diagnosis not present

## 2022-06-12 DIAGNOSIS — Z720 Tobacco use: Secondary | ICD-10-CM | POA: Diagnosis not present

## 2022-06-12 DIAGNOSIS — N50812 Left testicular pain: Secondary | ICD-10-CM | POA: Diagnosis not present

## 2022-06-12 DIAGNOSIS — Z6831 Body mass index (BMI) 31.0-31.9, adult: Secondary | ICD-10-CM | POA: Diagnosis not present

## 2022-06-12 DIAGNOSIS — G4721 Circadian rhythm sleep disorder, delayed sleep phase type: Secondary | ICD-10-CM | POA: Diagnosis not present

## 2022-06-12 DIAGNOSIS — R051 Acute cough: Secondary | ICD-10-CM | POA: Diagnosis not present

## 2022-06-12 DIAGNOSIS — N50811 Right testicular pain: Secondary | ICD-10-CM | POA: Diagnosis not present

## 2022-06-29 DIAGNOSIS — N50812 Left testicular pain: Secondary | ICD-10-CM | POA: Diagnosis not present

## 2022-06-29 DIAGNOSIS — N451 Epididymitis: Secondary | ICD-10-CM | POA: Diagnosis not present

## 2022-08-12 ENCOUNTER — Emergency Department (HOSPITAL_COMMUNITY): Payer: BC Managed Care – PPO

## 2022-08-12 ENCOUNTER — Emergency Department (HOSPITAL_COMMUNITY)
Admission: EM | Admit: 2022-08-12 | Discharge: 2022-08-12 | Disposition: A | Discharge status: Home / Self Care | Payer: BC Managed Care – PPO | Attending: Emergency Medicine | Admitting: Emergency Medicine

## 2022-08-12 DIAGNOSIS — I493 Ventricular premature depolarization: Secondary | ICD-10-CM | POA: Insufficient documentation

## 2022-08-12 DIAGNOSIS — R079 Chest pain, unspecified: Secondary | ICD-10-CM | POA: Diagnosis not present

## 2022-08-12 DIAGNOSIS — F17210 Nicotine dependence, cigarettes, uncomplicated: Secondary | ICD-10-CM | POA: Insufficient documentation

## 2022-08-12 DIAGNOSIS — R0789 Other chest pain: Secondary | ICD-10-CM | POA: Diagnosis not present

## 2022-08-12 LAB — CBC
HCT: 46 % (ref 39.0–52.0)
Hemoglobin: 15.3 g/dL (ref 13.0–17.0)
MCH: 28.5 pg (ref 26.0–34.0)
MCHC: 33.3 g/dL (ref 30.0–36.0)
MCV: 85.7 fL (ref 80.0–100.0)
Platelets: 175 10*3/uL (ref 150–400)
RBC: 5.37 MIL/uL (ref 4.22–5.81)
RDW: 13.7 % (ref 11.5–15.5)
WBC: 8.3 10*3/uL (ref 4.0–10.5)
nRBC: 0 % (ref 0.0–0.2)

## 2022-08-12 LAB — BASIC METABOLIC PANEL
Anion gap: 13 (ref 5–15)
BUN: 18 mg/dL (ref 6–20)
CO2: 22 mmol/L (ref 22–32)
Calcium: 9 mg/dL (ref 8.9–10.3)
Chloride: 101 mmol/L (ref 98–111)
Creatinine, Ser: 0.91 mg/dL (ref 0.61–1.24)
GFR, Estimated: >60 mL/min (ref >60)
Glucose, Bld: 119 mg/dL — ABNORMAL HIGH (ref 70–99)
Potassium: 3.9 mmol/L (ref 3.5–5.1)
Sodium: 136 mmol/L (ref 135–145)

## 2022-08-12 LAB — TROPONIN I (HIGH SENSITIVITY)
Troponin I (High Sensitivity): 4 ng/L (ref <18)
Troponin I (High Sensitivity): 4 ng/L (ref <18)

## 2022-08-12 NOTE — ED Provider Notes (Signed)
[facility] EMER[facility]ENC[facility] DEPART[facility] AT [facility] Provider Note   CSN: [id number] Arrival date & time: 08/12/22  0301     History  Chief Complaint  Patient presents with   Chest Pain    UGOCHUKWU NORUM is a 52 y.o. male.  The history is provided by the patient and medical records.  Chest Pain  52 y.o. with hx of eczema, insomnia, presenting to the ED for chest pain.  States happened tonight while singing karaoke.  States sharp, pulling sensation in left chest with radiation into left arm and neck.  States it felt like a squeezing sensation. No associated SOB, diaphoresis, nausea, or near syncope.  No prior cardiac hx.  No family cardiac hx.  He is a smoker but is working on quitting, less than 1 PPD.  Home Medications Prior to Admission medications   Medication Sig Start Date End Date Taking? Authorizing Provider  Melatonin 3 MG CAPS 1 capsule at bedtime as needed    [provider]  neomycin-polymyxin-hydrocortisone (CORTISPORIN) OTIC solution Apply 1-2 drops to toe after soaking BID 12/18/21   Wallene Huh, DPM  traZODone (DESYREL) 100 MG tablet Take 1 tablet by mouth at bedtime.    [provider]      Allergies    Penicillins and Erythromycin    Review of Systems   Review of Systems  Cardiovascular:  Positive for chest pain.  All other systems reviewed and are negative.   Physical Exam Updated Vital Signs BP (!) 178/99 (BP Location: Left Arm)   Pulse 83   Temp 98.4 F (36.9 C) (Oral)   Resp 20   SpO2 98%   Physical Exam Vitals and nursing note reviewed.  Constitutional:      Appearance: He is well-developed.  HENT:     Head: Normocephalic and atraumatic.  Eyes:     Conjunctiva/sclera: Conjunctivae normal.     Pupils: Pupils are equal, round, and reactive to light.  Cardiovascular:     Rate and Rhythm: Normal rate and regular rhythm.     Heart sounds: Normal heart sounds.  Pulmonary:     Effort: Pulmonary effort is normal.      Breath sounds: Normal breath sounds.  Abdominal:     General: Bowel sounds are normal.     Palpations: Abdomen is soft.  Musculoskeletal:        General: Normal range of motion.     Cervical back: Normal range of motion.  Skin:    General: Skin is warm and dry.  Neurological:     Mental Status: He is alert and oriented to person, place, and time.     ED Results / Procedures / Treatments   Labs (all labs ordered are listed, but only abnormal results are displayed) Labs Reviewed  BASIC METABOLIC PANEL - Abnormal; Notable for the following components:      Result Value   Glucose, Bld 119 (*)    All other components within normal limits  CBC  TROPONIN I (HIGH SENSITIVITY)  TROPONIN I (HIGH SENSITIVITY)    EKG EKG Interpretation  Date/Time:  Sunday August 12 2022 03:16:43 EDT Ventricular Rate:  83 PR Interval:  143 QRS Duration: 97 QT Interval:  366 QTC Calculation: 430 R Axis:   24 Text Interpretation: Sinus rhythm Abnormal R-wave progression, early transition Baseline wander in lead(s) V5 Otherwise within normal limits No old tracing to compare Confirmed by Delora Fuel ([id number]) on 08/12/2022 3:19:06 AM  Radiology No results found.  Procedures Procedures    Medications Ordered in ED Medications - No data to display  ED Course/ Medical Decision Making/ A&P                             Medical Decision Making Amount and/or Complexity of Data Reviewed Labs: ordered.   52 year old male presenting to the ED with chest pain.  Had episode last night of left-sided chest pain described as squeezing while singing karaoke.  Did have some pain in the left neck and left arm.  No other associated symptoms.  No cardiac history.  He is a smoker but is trying to quit.  EKG here is nonischemic.  Initial labs are reassuring--no leukocytosis, no electrolyte derangement.  Initial troponin is negative.  Chest x-ray is clear.  On repeat assessment patient is not having any ongoing  pain, however does report some "fluttering".  He was able to document times of this on his phone, I have looked at his central telemetry monitoring and appears he had several episodes of PVC's which I suspect is what he is feeling.  Will send delta trop.  If this is negative feel he will be stable for discharge to follow-up with PCP.  6:21 AM Delta trop remains negative.  Stable for discharge.  Final Clinical Impression(s) / ED Diagnoses Final diagnoses:  Chest pain in adult  PVC's (premature ventricular contractions)    Rx / DC Orders ED Discharge Orders     None         Larene Pickett, PA-C [id number]    Delora Fuel, MD [id number] [phone]

## 2022-08-12 NOTE — ED Triage Notes (Signed)
Patient arrived stating he was singing at [facility] and had a sharp pulling feeling in the left side of his chest into his arm and neck.

## 2022-08-12 NOTE — Discharge Instructions (Signed)
Cardiac work-up today was reassuring. Continue working on smoking cessation. Please follow-up with your primary care doctor. Return here for new concerns.

## 2023-04-22 DIAGNOSIS — R059 Cough, unspecified: Secondary | ICD-10-CM | POA: Diagnosis not present

## 2023-04-22 DIAGNOSIS — J180 Bronchopneumonia, unspecified organism: Secondary | ICD-10-CM | POA: Diagnosis not present

## 2023-05-01 DIAGNOSIS — G4733 Obstructive sleep apnea (adult) (pediatric): Secondary | ICD-10-CM | POA: Diagnosis not present

## 2023-05-31 DIAGNOSIS — G4733 Obstructive sleep apnea (adult) (pediatric): Secondary | ICD-10-CM | POA: Diagnosis not present

## 2023-07-01 DIAGNOSIS — G4733 Obstructive sleep apnea (adult) (pediatric): Secondary | ICD-10-CM | POA: Diagnosis not present

## 2023-07-25 DIAGNOSIS — G4700 Insomnia, unspecified: Secondary | ICD-10-CM | POA: Diagnosis not present

## 2023-07-25 DIAGNOSIS — R079 Chest pain, unspecified: Secondary | ICD-10-CM | POA: Diagnosis not present

## 2023-07-25 DIAGNOSIS — E782 Mixed hyperlipidemia: Secondary | ICD-10-CM | POA: Diagnosis not present

## 2023-07-25 DIAGNOSIS — R739 Hyperglycemia, unspecified: Secondary | ICD-10-CM | POA: Diagnosis not present

## 2023-08-20 DIAGNOSIS — G4733 Obstructive sleep apnea (adult) (pediatric): Secondary | ICD-10-CM | POA: Diagnosis not present

## 2023-08-29 DIAGNOSIS — R739 Hyperglycemia, unspecified: Secondary | ICD-10-CM | POA: Diagnosis not present

## 2023-08-29 DIAGNOSIS — E782 Mixed hyperlipidemia: Secondary | ICD-10-CM | POA: Diagnosis not present

## 2023-12-07 DIAGNOSIS — G4733 Obstructive sleep apnea (adult) (pediatric): Secondary | ICD-10-CM | POA: Diagnosis not present

## 2023-12-09 DIAGNOSIS — R7303 Prediabetes: Secondary | ICD-10-CM | POA: Diagnosis not present

## 2023-12-09 DIAGNOSIS — E782 Mixed hyperlipidemia: Secondary | ICD-10-CM | POA: Diagnosis not present

## 2024-01-07 DIAGNOSIS — G4733 Obstructive sleep apnea (adult) (pediatric): Secondary | ICD-10-CM | POA: Diagnosis not present

## 2024-02-07 DIAGNOSIS — G4733 Obstructive sleep apnea (adult) (pediatric): Secondary | ICD-10-CM | POA: Diagnosis not present

## 2024-02-27 DIAGNOSIS — E119 Type 2 diabetes mellitus without complications: Secondary | ICD-10-CM | POA: Diagnosis not present

## 2024-02-27 DIAGNOSIS — Z720 Tobacco use: Secondary | ICD-10-CM | POA: Diagnosis not present

## 2024-02-27 DIAGNOSIS — E663 Overweight: Secondary | ICD-10-CM | POA: Diagnosis not present

## 2024-02-27 DIAGNOSIS — R202 Paresthesia of skin: Secondary | ICD-10-CM | POA: Diagnosis not present

## 2024-03-30 DIAGNOSIS — E119 Type 2 diabetes mellitus without complications: Secondary | ICD-10-CM | POA: Diagnosis not present

## 2024-05-20 DIAGNOSIS — K5903 Drug induced constipation: Secondary | ICD-10-CM | POA: Diagnosis not present

## 2024-05-20 DIAGNOSIS — Z6828 Body mass index (BMI) 28.0-28.9, adult: Secondary | ICD-10-CM | POA: Diagnosis not present

## 2024-05-20 DIAGNOSIS — K644 Residual hemorrhoidal skin tags: Secondary | ICD-10-CM | POA: Diagnosis not present
